# Patient Record
Sex: Female | Born: 1970 | Race: White | Marital: Married | Smoking: Never smoker
Health system: Southern US, Community
[De-identification: ages and names within clinical notes are randomized; demographics above are authoritative.]

---

## 2004-01-07 HISTORY — PX: EYE SURGERY: SHX253

## 2009-01-06 HISTORY — PX: BREAST SURGERY: SHX581

## 2013-03-29 DIAGNOSIS — N840 Polyp of corpus uteri: Secondary | ICD-10-CM | POA: Insufficient documentation

## 2016-12-18 DIAGNOSIS — E041 Nontoxic single thyroid nodule: Secondary | ICD-10-CM | POA: Insufficient documentation

## 2018-04-23 ENCOUNTER — Other Ambulatory Visit: Payer: Self-pay

## 2018-04-23 ENCOUNTER — Telehealth: Payer: Self-pay

## 2018-04-23 ENCOUNTER — Telehealth: Payer: Self-pay | Admitting: Gastroenterology

## 2018-04-23 ENCOUNTER — Ambulatory Visit (INDEPENDENT_AMBULATORY_CARE_PROVIDER_SITE_OTHER): Payer: Managed Care, Other (non HMO) | Admitting: Gastroenterology

## 2018-04-23 DIAGNOSIS — K219 Gastro-esophageal reflux disease without esophagitis: Secondary | ICD-10-CM

## 2018-04-23 DIAGNOSIS — Z1211 Encounter for screening for malignant neoplasm of colon: Secondary | ICD-10-CM

## 2018-04-23 DIAGNOSIS — Z800 Family history of malignant neoplasm of digestive organs: Secondary | ICD-10-CM

## 2018-04-23 NOTE — Progress Notes (Signed)
Jeanette Nichols  [address]  [place]  [place], [place] [address]  Main: [phone]  Fax: [phone]   Gastroenterology Consultation  Referring Provider:     Rowan Blase Primary Care Physician:  Rowan Blase Reason for Consultation:    Colonoscopy         HPI:   Virtual Visit via video  Note  I connected with patient on 04/23/18 at  9:00 AM EDT by video  and verified that I am speaking with the correct person using two identifiers.   I discussed the limitations, risks, security and privacy concerns of performing an evaluation and management service by video and the availability of in person appointments. I also discussed with the patient that there may be a patient responsible charge related to this service. The patient expressed understanding and agreed to proceed.  Location of the patient: Home Location of provider: Home Participating persons: Patient and provider only   History of Present Illness: Chief Complaint  Patient presents with  . Gastroesophageal Reflux     Jeanette Nichols is a 48 y.o. y/o female referred for consultation & management  by Dr. Earlean Shawl, Elenore Paddy, PA-C.     She says that she was "old enough' to have a colonoscopy, never had one before, her father had colon cancer at age 53. Denies any change in bowel habits, rectal bleeding. No change in the shape of her stool.   No issues with siblings. She she also has GERD "very bad"   Reflux:  Onset : 4 years-since getting worse  Symptoms: she says has a burning sensation her throat- TUMS helps right away . Diet is healthy Recent weight gain: no  Medications: no  Narcotics or anticholinergics use : none  PPI /H2 blockers or Antacid  use and timing :no Dinner time : 6-7 pm , goes to bed around 10 pm upright in between   Prior EGD: no  Family history of esophageal cancer:no  Not a smoker.    She weighs 150 lbs and is 5 feet 2 inches- BMI is 27   Prior to Admission  medications   Not on File    No family history on file.   Social History   Tobacco Use  . Smoking status: Not on file  Substance Use Topics  . Alcohol use: Not on file  . Drug use: Not on file    Allergies as of 04/23/2018 - Review Complete 04/23/2018  Allergen Reaction Noted  . Sulfa antibiotics Hives and Itching 08/05/2012  . Penicillins  04/23/2018    Review of Systems:    All systems reviewed and negative except where noted in HPI. General Appearance:    Alert, cooperative, no distress, appears stated age  Head:    Normocephalic, without obvious abnormality, atraumatic  Eyes:    PERRL, conjunctiva/corneas clear,  Ears:    Grossly normal hearing    Neurologic:   Grossly appears normal     Observations/Objective:  Labs: CBC No results found for: WBC, RBC, HGB, HCT, PLT, MCV, MCH, MCHC, RDW, LYMPHSABS, MONOABS, EOSABS, BASOSABS CMP  No results found for: NA, K, CL, CO2, GLUCOSE, BUN, CREATININE, CALCIUM, PROT, ALBUMIN, AST, ALT, ALKPHOS, BILITOT, GFRNONAA, GFRAA  Imaging Studies: No results found.  Assessment and Plan:   Emillee Talsma is a 48 y.o. y/o female has been referred for screening colonoscopy-high risk-father had colon cancer- no red flag symptoms, also has GERD    Plan :  GERD : Counseled on life style changes, suggest to use PPI first thing in the morning on empty stomach and eat 30 minutes after. Advised on the use of a wedge pillow at night , avoid meals for 2 hours prior to bed time. Weight loss . She will try life style changes and if no better will start on medications  Screening colonoscopy    I have discussed alternative options, risks & benefits,  which include, but are not limited to, bleeding, infection, perforation,respiratory complication & drug reaction.  The patient agrees with this plan & written consent will be obtained.    Follow Up Instructions:   4 weeks web visit  I discussed the assessment and treatment plan with the  patient. The patient was provided an opportunity to ask questions and all were answered. The patient agreed with the plan and demonstrated an understanding of the instructions.   The patient was advised to call back or seek an in-person evaluation if the symptoms worsen or if the condition fails to improve as anticipated.    Dr Jeanette Bellows MD,MRCP [facility]) Gastroenterology/Hepatology Pager: [phone]   Speech recognition software was used to dictate the above note.

## 2018-04-23 NOTE — Telephone Encounter (Signed)
Patient returned Michelle's call to schedule a colonoscopy.

## 2018-04-23 NOTE — Telephone Encounter (Signed)
Patients screening colonoscopy has been scheduled for July 7th with Dr. Vicente Males at [facility].  Thanks [employer]

## 2018-04-23 NOTE — Telephone Encounter (Signed)
LVM for pt to call office to schedule her screening colonoscopy with Dr. Vicente Males for June/July.  Thanks [employer]

## 2018-04-26 ENCOUNTER — Ambulatory Visit: Payer: Managed Care, Other (non HMO) | Admitting: Gastroenterology

## 2018-05-20 ENCOUNTER — Ambulatory Visit (INDEPENDENT_AMBULATORY_CARE_PROVIDER_SITE_OTHER): Payer: Managed Care, Other (non HMO) | Admitting: Gastroenterology

## 2018-05-20 DIAGNOSIS — K219 Gastro-esophageal reflux disease without esophagitis: Secondary | ICD-10-CM

## 2018-05-20 NOTE — Progress Notes (Signed)
    Jonathon Bellows , MD [address]  [place]  [place], [place] [address]  Main: [phone]  Fax: [phone]   Primary Care Physician: Lawerance Cruel, PA-C  Virtual Visit via Video Note  I connected with patient on 05/20/18 at  8:30 AM EDT by video and verified that I am speaking with the correct person using two identifiers.   I discussed the limitations, risks, security and privacy concerns of performing an evaluation and management service by video  and the availability of in person appointments. I also discussed with the patient that there may be a patient responsible charge related to this service. The patient expressed understanding and agreed to proceed.  Location of Patient: Home Location of Provider: Home Persons involved: Patient and provider only   History of Present Illness: Chief Complaint  Patient presents with  . Follow-up    Gastroesophageal reflux disease    HPI: Jeanette Nichols is a 48 y.o. female   Summary of history :  Initially referred and seen in 04/2018 for  screening colonoscopy-high risk-father had colon cancer and GERD Colonoscopy : never had one before, her father had colon cancer at age 73. Denies any change in bowel habits, rectal bleeding. No change in the shape of her stool.    GERD: 4 years , burning sensation in the throat, relieved with TUMS,BMI 27   Interval history   04/23/2018-  05/20/2018  Wedge pillow helps at night, still has burning at the level of the collar bones, at night goes into her throat.   Due for colonoscopy on July 7th.     No current outpatient medications on file.   No current facility-administered medications for this visit.     Allergies as of 05/20/2018 - Review Complete 05/20/2018  Allergen Reaction Noted  . Sulfa antibiotics Hives and Itching 08/05/2012  . Penicillins  04/23/2018    Review of Systems:    All systems reviewed and negative except where noted in HPI.  General Appearance:     Alert, cooperative, no distress, appears stated age  Head:    Normocephalic, without obvious abnormality, atraumatic  Eyes:    PERRL, conjunctiva/corneas clear,  Ears:    Grossly normal hearing    Neurologic:  Grossly normal    Observations/Objective:  Labs: CMP  No results found for: NA, K, CL, CO2, GLUCOSE, BUN, CREATININE, CALCIUM, PROT, ALBUMIN, AST, ALT, ALKPHOS, BILITOT, GFRNONAA, GFRAA No results found for: WBC, HGB, HCT, MCV, PLT  Imaging Studies: No results found.  Assessment and Plan:   Jeanette Nichols is a 48 y.o. y/o female here to follow up for screening colonoscopy-high risk-father had colon cancer- no red flag symptoms, also has GERD- life style changes have helped but no resolved . Colonoscopy scheduled for July     Plan :   1. Commence on omeprazole 20 mg, if no better she will call me , may need to add EGD.  F/u in3 months    I discussed the assessment and treatment plan with the patient. The patient was provided an opportunity to ask questions and all were answered. The patient agreed with the plan and demonstrated an understanding of the instructions.   The patient was advised to call back or seek an in-person evaluation if the symptoms worsen or if the condition fails to improve as anticipated.   Dr Jonathon Bellows MD,MRCP [facility]) Gastroenterology/Hepatology Pager: [phone]   Speech recognition software was used to dictate this note.

## 2018-05-21 ENCOUNTER — Ambulatory Visit: Payer: Managed Care, Other (non HMO) | Admitting: Gastroenterology

## 2018-06-28 ENCOUNTER — Telehealth: Payer: Self-pay | Admitting: Gastroenterology

## 2018-06-28 NOTE — Telephone Encounter (Signed)
ok 

## 2018-06-28 NOTE — Telephone Encounter (Signed)
Patient called to cancel colonoscopy 07-13-18.

## 2018-06-29 NOTE — Telephone Encounter (Signed)
Called pt to possibly reschedule her colonoscopy  Unable to contact, LVM to return call

## 2018-07-01 NOTE — Telephone Encounter (Signed)
Pt procedure has been cancelled.

## 2018-07-09 ENCOUNTER — Other Ambulatory Visit: Admission: RE | Admit: 2018-07-09 | Payer: Managed Care, Other (non HMO) | Source: Ambulatory Visit

## 2018-07-13 ENCOUNTER — Ambulatory Visit: Admit: 2018-07-13 | Payer: Managed Care, Other (non HMO) | Admitting: Gastroenterology

## 2018-07-13 SURGERY — COLONOSCOPY WITH PROPOFOL
Anesthesia: General

## 2018-08-26 ENCOUNTER — Ambulatory Visit: Payer: Managed Care, Other (non HMO) | Admitting: Gastroenterology

## 2019-11-29 ENCOUNTER — Encounter: Payer: Self-pay | Admitting: Adult Health

## 2019-11-29 ENCOUNTER — Other Ambulatory Visit: Payer: Self-pay

## 2019-11-29 ENCOUNTER — Ambulatory Visit
Admission: RE | Admit: 2019-11-29 | Discharge: 2019-11-29 | Disposition: A | Discharge status: Home / Self Care | Payer: Managed Care, Other (non HMO) | Attending: Adult Health | Admitting: Adult Health

## 2019-11-29 ENCOUNTER — Ambulatory Visit (INDEPENDENT_AMBULATORY_CARE_PROVIDER_SITE_OTHER): Payer: Managed Care, Other (non HMO) | Admitting: Adult Health

## 2019-11-29 ENCOUNTER — Ambulatory Visit
Admission: RE | Admit: 2019-11-29 | Discharge: 2019-11-29 | Disposition: A | Discharge status: Home / Self Care | Payer: Managed Care, Other (non HMO) | Source: Ambulatory Visit | Attending: Adult Health | Admitting: Adult Health

## 2019-11-29 VITALS — BP 132/72 | HR 68 | Temp 98.2°F | Resp 16 | Ht 62.0 in | Wt 150.0 lb

## 2019-11-29 DIAGNOSIS — R059 Cough, unspecified: Secondary | ICD-10-CM | POA: Diagnosis present

## 2019-11-29 DIAGNOSIS — Z6827 Body mass index (BMI) 27.0-27.9, adult: Secondary | ICD-10-CM | POA: Diagnosis not present

## 2019-11-29 DIAGNOSIS — N921 Excessive and frequent menstruation with irregular cycle: Secondary | ICD-10-CM | POA: Insufficient documentation

## 2019-11-29 DIAGNOSIS — Z0000 Encounter for general adult medical examination without abnormal findings: Secondary | ICD-10-CM | POA: Diagnosis not present

## 2019-11-29 DIAGNOSIS — Z1211 Encounter for screening for malignant neoplasm of colon: Secondary | ICD-10-CM | POA: Diagnosis not present

## 2019-11-29 DIAGNOSIS — E559 Vitamin D deficiency, unspecified: Secondary | ICD-10-CM

## 2019-11-29 NOTE — Patient Instructions (Addendum)
Fat and Cholesterol Restricted Eating Plan Getting too much fat and cholesterol in your diet may cause health problems. Choosing the right foods helps keep your fat and cholesterol at normal levels. This can keep you from getting certain diseases. Your doctor may recommend an eating plan that includes:  Total fat: ______% or less of total calories a day.  Saturated fat: ______% or less of total calories a day.  Cholesterol: less than _________mg a day.  Fiber: ______g a day. What are tips for following this plan? Meal planning  At meals, divide your plate into four equal parts: ? Fill one-half of your plate with vegetables and green salads. ? Fill one-fourth of your plate with whole grains. ? Fill one-fourth of your plate with low-fat (lean) protein foods.  Eat fish that is high in omega-3 fats at least two times a week. This includes mackerel, tuna, sardines, and salmon.  Eat foods that are high in fiber, such as whole grains, beans, apples, broccoli, carrots, peas, and barley. General tips   Work with your doctor to lose weight if you need to.  Avoid: ? Foods with added sugar. ? Fried foods. ? Foods with partially hydrogenated oils.  Limit alcohol intake to no more than 1 drink a day for nonpregnant women and 2 drinks a day for men. One drink equals 12 oz of beer, 5 oz of wine, or 1 oz of hard liquor. Reading food labels  Check food labels for: ? Trans fats. ? Partially hydrogenated oils. ? Saturated fat (g) in each serving. ? Cholesterol (mg) in each serving. ? Fiber (g) in each serving.  Choose foods with healthy fats, such as: ? Monounsaturated fats. ? Polyunsaturated fats. ? Omega-3 fats.  Choose grain products that have whole grains. Look for the word "whole" as the first word in the ingredient list. Cooking  Cook foods using low-fat methods. These include baking, boiling, grilling, and broiling.  Eat more home-cooked foods. Eat at restaurants and  buffets less often.  Avoid cooking using saturated fats, such as butter, cream, palm oil, palm kernel oil, and coconut oil. Recommended foods  Fruits  All fresh, canned (in natural juice), or frozen fruits. Vegetables  Fresh or frozen vegetables (raw, steamed, roasted, or grilled). Green salads. Grains  Whole grains, such as whole wheat or whole grain breads, crackers, cereals, and pasta. Unsweetened oatmeal, bulgur, barley, quinoa, or brown rice. Corn or whole wheat flour tortillas. Meats and other protein foods  Ground beef (85% or leaner), grass-fed beef, or beef trimmed of fat. Skinless chicken or [place]. Ground chicken or [place]. Pork trimmed of fat. All fish and seafood. Egg whites. Dried beans, peas, or lentils. Unsalted nuts or seeds. Unsalted canned beans. Nut butters without added sugar or oil. Dairy  Low-fat or nonfat dairy products, such as skim or 1% milk, 2% or reduced-fat cheeses, low-fat and fat-free ricotta or cottage cheese, or plain low-fat and nonfat yogurt. Fats and oils  Tub margarine without trans fats. Light or reduced-fat mayonnaise and salad dressings. Avocado. Olive, canola, sesame, or safflower oils. The items listed above may not be a complete list of foods and beverages you can eat. Contact a dietitian for more information. Foods to avoid Fruits  Canned fruit in heavy syrup. Fruit in cream or butter sauce. Fried fruit. Vegetables  Vegetables cooked in cheese, cream, or butter sauce. Fried vegetables. Grains  White bread. White pasta. White rice. Cornbread. Bagels, pastries, and croissants. Crackers and snack foods that contain trans  fat and hydrogenated oils. Meats and other protein foods  Fatty cuts of meat. Ribs, chicken wings, bacon, sausage, bologna, salami, chitterlings, fatback, hot dogs, bratwurst, and packaged lunch meats. Liver and organ meats. Whole eggs and egg yolks. Chicken and [place] with skin. Fried meat. Dairy  Whole or 2% milk,  cream, half-and-half, and cream cheese. Whole milk cheeses. Whole-fat or sweetened yogurt. Full-fat cheeses. Nondairy creamers and whipped toppings. Processed cheese, cheese spreads, and cheese curds. Beverages  Alcohol. Sugar-sweetened drinks such as sodas, lemonade, and fruit drinks. Fats and oils  Butter, stick margarine, lard, shortening, ghee, or bacon fat. Coconut, palm kernel, and palm oils. Sweets and desserts  Corn syrup, sugars, honey, and molasses. Candy. Jam and jelly. Syrup. Sweetened cereals. Cookies, pies, cakes, donuts, muffins, and ice cream. The items listed above may not be a complete list of foods and beverages you should avoid. Contact a dietitian for more information. Summary  Choosing the right foods helps keep your fat and cholesterol at normal levels. This can keep you from getting certain diseases.  At meals, fill one-half of your plate with vegetables and green salads.  Eat high-fiber foods, like whole grains, beans, apples, carrots, peas, and barley.  Limit added sugar, saturated fats, alcohol, and fried foods. This information is not intended to replace advice given to you by your health care provider. Make sure you discuss any questions you have with your health care provider. Document Revised: 08/26/2017 Document Reviewed: 09/09/2016 Elsevier Patient Education  [place].  Bronchospasm, Adult  Bronchospasm is when airways in the lungs get smaller. When this happens, it can be hard to breathe. You may cough. You may also make a whistling sound when you breathe (wheeze). Follow these instructions at home: Medicines  Take over-the-counter and prescription medicines only as told by your doctor.  If you need to use an inhaler or nebulizer to take your medicine, ask your doctor how to use it.  If you were given a spacer, always use it with your inhaler. Lifestyle  Change your heating and air conditioning filter. Do this at least once a  month.  Try not to use fireplaces and wood stoves.  Do not  smoke. Do not  allow smoking in your home.  Try not to use things that have a strong smell, like perfume.  Get rid of pests (such as roaches and mice) and their poop.  Remove any mold from your home.  Keep your house clean. Get rid of dust.  Use cleaning products that have no smell.  Replace carpet with wood, tile, or vinyl flooring.  Use allergy-proof pillows, mattress covers, and box spring covers.  Wash bed sheets and blankets every week. Use hot water. Dry them in a dryer.  Use blankets that are made of polyester or cotton.  Wash your hands often.  Keep pets out of your bedroom.  When you exercise, try not to breathe in cold air. General instructions  Have a plan for getting medical care. Know these things: ? When to call your doctor. ? When to call local emergency services (911 in the [place] ? Where to go in an emergency.  Stay up to date on your shots (immunizations).  When you have an episode: ? Stay calm. ? Relax. ? Breathe slowly. Contact a doctor if:  Your muscles ache.  Your chest hurts.  The color of the mucus you cough up (sputum) changes from clear or white to yellow, green, gray, or bloody.  The mucus  you cough up gets thicker.  You have a fever. Get help right away if:  The whistling sound gets worse, even after you take your medicines.  Your coughing gets worse.  You find it even harder to breathe.  Your chest hurts very much. Summary  Bronchospasm is when airways in the lungs get smaller.  When this happens, it can be hard to breathe. You may cough. You may also make a whistling sound when you breathe.  Stay away from things that cause you to have episodes. These include smoke or dust. This information is not intended to replace advice given to you by your health care provider. Make sure you discuss any questions you have with your health care provider. Document  Revised: 12/05/2016 Document Reviewed: 12/27/2015 Elsevier Patient Education  2020 [place] Maintenance, Female Adopting a healthy lifestyle and getting preventive care are important in promoting health and wellness. Ask your health care provider about:  The right schedule for you to have regular tests and exams.  Things you can do on your own to prevent diseases and keep yourself healthy. What should I know about diet, weight, and exercise? Eat a healthy diet   Eat a diet that includes plenty of vegetables, fruits, low-fat dairy products, and lean protein.  Do not eat a lot of foods that are high in solid fats, added sugars, or sodium. Maintain a healthy weight Body mass index (BMI) is used to identify weight problems. It estimates body fat based on height and weight. Your health care provider can help determine your BMI and help you achieve or maintain a healthy weight. Get regular exercise Get regular exercise. This is one of the most important things you can do for your health. Most adults should:  Exercise for at least 150 minutes each week. The exercise should increase your heart rate and make you sweat (moderate-intensity exercise).  Do strengthening exercises at least twice a week. This is in addition to the moderate-intensity exercise.  Spend less time sitting. Even light physical activity can be beneficial. Watch cholesterol and blood lipids Have your blood tested for lipids and cholesterol at 50 years of age, then have this test every 5 years. Have your cholesterol levels checked more often if:  Your lipid or cholesterol levels are high.  You are older than 50 years of age.  You are at high risk for heart disease. What should I know about cancer screening? Depending on your health history and family history, you may need to have cancer screening at various ages. This may include screening for:  Breast cancer.  Cervical cancer.  Colorectal  cancer.  Skin cancer.  Lung cancer. What should I know about heart disease, diabetes, and high blood pressure? Blood pressure and heart disease  High blood pressure causes heart disease and increases the risk of stroke. This is more likely to develop in people who have high blood pressure readings, are of African descent, or are overweight.  Have your blood pressure checked: ? Every 3-5 years if you are 82-10 years of age. ? Every year if you are 41 years old or older. Diabetes Have regular diabetes screenings. This checks your fasting blood sugar level. Have the screening done:  Once every three years after age 44 if you are at a normal weight and have a low risk for diabetes.  More often and at a younger age if you are overweight or have a high risk for diabetes. What should I know about  preventing infection? Hepatitis B If you have a higher risk for hepatitis B, you should be screened for this virus. Talk with your health care provider to find out if you are at risk for hepatitis B infection. Hepatitis C Testing is recommended for:  Everyone born from [birth year] through 1966.  Anyone with known risk factors for hepatitis C. Sexually transmitted infections (STIs)  Get screened for STIs, including gonorrhea and chlamydia, if: ? You are sexually active and are younger than 50 years of age. ? You are older than 50 years of age and your health care provider tells you that you are at risk for this type of infection. ? Your sexual activity has changed since you were last screened, and you are at increased risk for chlamydia or gonorrhea. Ask your health care provider if you are at risk.  Ask your health care provider about whether you are at high risk for HIV. Your health care provider may recommend a prescription medicine to help prevent HIV infection. If you choose to take medicine to prevent HIV, you should first get tested for HIV. You should then be tested every 3 months for as long as  you are taking the medicine. Pregnancy  If you are about to stop having your period (premenopausal) and you may become pregnant, seek counseling before you get pregnant.  Take 400 to 800 micrograms (mcg) of folic acid every day if you become pregnant.  Ask for birth control (contraception) if you want to prevent pregnancy. Osteoporosis and menopause Osteoporosis is a disease in which the bones lose minerals and strength with aging. This can result in bone fractures. If you are 2 years old or older, or if you are at risk for osteoporosis and fractures, ask your health care provider if you should:  Be screened for bone loss.  Take a calcium or vitamin D supplement to lower your risk of fractures.  Be given hormone replacement therapy (HRT) to treat symptoms of menopause. Follow these instructions at home: Lifestyle  Do not use any products that contain nicotine or tobacco, such as cigarettes, e-cigarettes, and chewing tobacco. If you need help quitting, ask your health care provider.  Do not use street drugs.  Do not share needles.  Ask your health care provider for help if you need support or information about quitting drugs. Alcohol use  Do not drink alcohol if: ? Your health care provider tells you not to drink. ? You are pregnant, may be pregnant, or are planning to become pregnant.  If you drink alcohol: ? Limit how much you use to 0-1 drink a day. ? Limit intake if you are breastfeeding.  Be aware of how much alcohol is in your drink. In the [place] one drink equals one 12 oz bottle of beer (355 mL), one 5 oz glass of wine (148 mL), or one 1 oz glass of hard liquor (44 mL). General instructions  Schedule regular health, dental, and eye exams.  Stay current with your vaccines.  Tell your health care provider if: ? You often feel depressed. ? You have ever been abused or do not feel safe at home. Summary  Adopting a healthy lifestyle and getting preventive care are  important in promoting health and wellness.  Follow your health care provider's instructions about healthy diet, exercising, and getting tested or screened for diseases.  Follow your health care provider's instructions on monitoring your cholesterol and blood pressure. This information is not intended to replace advice given to you  by your health care provider. Make sure you discuss any questions you have with your health care provider. Document Revised: 12/16/2017 Document Reviewed: 12/16/2017 Elsevier Patient Education  2020 [employer].

## 2019-11-29 NOTE — Progress Notes (Signed)
Chest x ray within normal limits.

## 2019-11-29 NOTE — Progress Notes (Signed)
New patient visit   Patient: Jeanette Nichols   DOB: 10/21/1970   49 y.o. Female  MRN: [id number] Visit Date: 11/29/2019  Today's healthcare provider: Marcille Buffy, FNP   Chief Complaint  Patient presents with  . New Patient (Initial Visit)   Subjective    Jeanette Nichols is a 49 y.o. female who presents today as a new patient to establish care.  HPI  Patient transferring from [facility] in [place].  Patient sees [facility].  She reports she was really sick before covid started with a flu like illness. She feels she maybe had covid. She is not vaccinated. She is still traveling. Not sick sense then. She feels as if since then she has a feeling of tickling making her want to cough with deep breathing. She has no productive cough. Denies any post nasal drainage or productive cough.  Denies any history of Asthma history. Denies any drug use, or smoking history.   Alcohol occasional - weekly - wine 1-2 glasses at night.   Mammogram up to date 3 D done at gynecologist.   She has had PAP smear this year and was normal. - [facility] see [facility].  History of D & C. She was  scheduled for ablation but she ended up canceling as she feels she is going peri- menopausal. Irregular cycles skipping 1- 2 months. Denies any irregular bleeding now.   Denies any abdominal pain.   She has not had colonoscopy.   Patient  denies any fever, body aches,chills, rash, chest pain, shortness of breath, nausea, vomiting, or diarrhea.  Denies dizziness, lightheadedness, pre syncopal or syncopal episodes.  Would like Cologuard and defer colonoscopy at this time.   She had labs within the last year at November.     Allergies  Allergen Reactions  . Sulfa Antibiotics Hives and Itching    Other reaction(s): HIVES   . Penicillins      History reviewed. No pertinent past medical history. Past Surgical History:  Procedure Laterality Date    . BREAST SURGERY Bilateral 2011   reduction  . EYE SURGERY Bilateral 2006   [place]   Family Status  Relation Name Status  . Mother  Alive  . Father  Alive  . Sister  Alive  . Daughter  Alive  . Son  Alive  . Ethlyn Daniels  (Not Specified)  . PGF  (Not Specified)   Family History  Problem Relation Age of Onset  . Healthy Mother   . Colon cancer Father 68  . Healthy Sister   . Healthy Daughter   . Healthy Son   . Breast cancer Paternal Aunt   . Diabetes Paternal Grandfather    Social History   Socioeconomic History  . Marital status: Married    Spouse name: Not on file  . Number of children: 2  . Years of education: Not on file  . Highest education level: Not on file  Occupational History  . Not on file  Tobacco Use  . Smoking status: Never Smoker  . Smokeless tobacco: Never Used  Vaping Use  . Vaping Use: Never used  Substance and Sexual Activity  . Alcohol use: Yes    Alcohol/week: 5.0 standard drinks    Types: 5 Glasses of wine per week  . Drug use: Never  . Sexual activity: Not on file  Other Topics Concern  . Not on file  Social History Narrative  .  Not on file   Social Determinants of Health   Financial Resource Strain:   . Difficulty of Paying Living Expenses: Not on file  Food Insecurity:   . Worried About Charity fundraiser in the Last Year: Not on file  . Ran Out of Food in the Last Year: Not on file  Transportation Needs:   . Lack of Transportation (Medical): Not on file  . Lack of Transportation (Non-Medical): Not on file  Physical Activity:   . Days of Exercise per Week: Not on file  . Minutes of Exercise per Session: Not on file  Stress:   . Feeling of Stress : Not on file  Social Connections:   . Frequency of Communication with Friends and Family: Not on file  . Frequency of Social Gatherings with Friends and Family: Not on file  . Attends Religious Services: Not on file  . Active Member of Clubs or Organizations: Not on file  . Attends  Archivist Meetings: Not on file  . Marital Status: Not on file   No outpatient medications prior to visit.   No facility-administered medications prior to visit.   Allergies  Allergen Reactions  . Sulfa Antibiotics Hives and Itching    Other reaction(s): HIVES   . Penicillins      There is no immunization history on file for this patient.  Health Maintenance  Topic Date Due  . PAP SMEAR-Modifier  11/29/2019 (Originally 10/17/1990)  . COVID-19 Vaccine (1) 12/15/2019 (Originally 10/16/1981)  . INFLUENZA VACCINE  04/05/2020 (Originally 08/07/2019)  . MAMMOGRAM  11/28/2020 (Originally 10/17/2019)  . COLONOSCOPY  11/28/2020 (Originally 10/17/2019)  . TETANUS/TDAP  11/28/2020 (Originally 10/16/1988)  . Hepatitis C Screening  11/28/2020 (Originally 11/04/1970)  . HIV Screening  11/28/2020 (Originally 10/16/1984)    Patient Care Team: Charod Slawinski, Kelby Aline, FNP as PCP - General (Family Medicine)  Review of Systems  Constitutional: Negative.   HENT: Negative.   Respiratory: Positive for cough. Negative for apnea, choking, chest tightness, shortness of breath, wheezing and stridor.   Cardiovascular: Negative.   Gastrointestinal: Negative.   Genitourinary: Negative.   Musculoskeletal: Negative.   Skin: Negative.   Neurological: Negative.   Hematological: Negative.   Psychiatric/Behavioral: Negative.       Objective    BP 132/72 (BP Location: Left Arm, Patient Position: Sitting, Cuff Size: Normal)   Pulse 68   Temp 98.2 F (36.8 C) (Oral)   Resp 16   Ht 5\' 2"  (1.575 m)   Wt 150 lb (68 kg)   LMP 09/26/2019 (Approximate)   SpO2 98%   BMI 27.44 kg/m  Physical Exam Vitals reviewed.  Constitutional:      General: She is not in acute distress.    Appearance: Normal appearance. She is well-developed. She is not ill-appearing, toxic-appearing or diaphoretic.     Interventions: She is not intubated.    Comments: Patient appers well, not sickly. Speaking in  complete sentences. Patient moves on and off of exam table and in room without difficulty. Gait is normal in hall and in room. Patient is oriented to person place time and situation. Patient answers questions appropriately and engages eye contact and verbal dialect with provider.   HENT:     Head: Normocephalic and atraumatic.     Right Ear: External ear normal.     Left Ear: External ear normal.     Nose: Nose normal.     Mouth/Throat:     Pharynx: No oropharyngeal exudate.  Eyes:  General: Lids are normal. No scleral icterus.       Right eye: No discharge.        Left eye: No discharge.     Conjunctiva/sclera: Conjunctivae normal.     Right eye: Right conjunctiva is not injected. No exudate or hemorrhage.    Left eye: Left conjunctiva is not injected. No exudate or hemorrhage.    Pupils: Pupils are equal, round, and reactive to light.  Neck:     Thyroid: No thyroid mass or thyromegaly.     Vascular: Normal carotid pulses. No carotid bruit, hepatojugular reflux or JVD.     Trachea: Trachea and phonation normal. No tracheal tenderness or tracheal deviation.     Meningeal: Brudzinski's sign and Kernig's sign absent.  Cardiovascular:     Rate and Rhythm: Normal rate and regular rhythm.     Pulses: Normal pulses.          Radial pulses are 2+ on the right side and 2+ on the left side.       Dorsalis pedis pulses are 2+ on the right side and 2+ on the left side.       Posterior tibial pulses are 2+ on the right side and 2+ on the left side.     Heart sounds: Normal heart sounds, S1 normal and S2 normal. Heart sounds not distant. No murmur heard.  No friction rub. No gallop.   Pulmonary:     Effort: Pulmonary effort is normal. No tachypnea, bradypnea, accessory muscle usage or respiratory distress. She is not intubated.     Breath sounds: Normal breath sounds. No stridor. No wheezing, rhonchi or rales.  Chest:     Chest wall: No tenderness.  Abdominal:     General: Bowel sounds are  normal. There is no distension or abdominal bruit.     Palpations: Abdomen is soft. There is no shifting dullness, fluid wave, hepatomegaly, splenomegaly, mass or pulsatile mass.     Tenderness: There is no abdominal tenderness. There is no right CVA tenderness, left CVA tenderness, guarding or rebound.     Hernia: No hernia is present.  Musculoskeletal:        General: No tenderness or deformity. Normal range of motion.     Cervical back: Full passive range of motion without pain, normal range of motion and neck supple. No edema, erythema, rigidity or tenderness. No spinous process tenderness or muscular tenderness. Normal range of motion.  Lymphadenopathy:     Head:     Right side of head: No submental, submandibular, tonsillar, preauricular, posterior auricular or occipital adenopathy.     Left side of head: No submental, submandibular, tonsillar, preauricular, posterior auricular or occipital adenopathy.     Cervical: No cervical adenopathy.     Right cervical: No superficial, deep or posterior cervical adenopathy.    Left cervical: No superficial, deep or posterior cervical adenopathy.     Upper Body:     Right upper body: No supraclavicular or pectoral adenopathy.     Left upper body: No supraclavicular or pectoral adenopathy.  Skin:    General: Skin is warm and dry.     Coloration: Skin is not pale.     Findings: No abrasion, bruising, burn, ecchymosis, erythema, lesion, petechiae or rash.     Nails: There is no clubbing.  Neurological:     General: No focal deficit present.     Mental Status: She is alert and oriented to person, place, and time.  GCS: GCS eye subscore is 4. GCS verbal subscore is 5. GCS motor subscore is 6.     Cranial Nerves: No cranial nerve deficit.     Sensory: No sensory deficit.     Motor: No weakness, tremor, atrophy, abnormal muscle tone or seizure activity.     Coordination: Coordination normal.     Gait: Gait normal.     Deep Tendon Reflexes:  Reflexes are normal and symmetric. Reflexes normal. Babinski sign absent on the right side. Babinski sign absent on the left side.     Reflex Scores:      Tricep reflexes are 2+ on the right side and 2+ on the left side.      Bicep reflexes are 2+ on the right side and 2+ on the left side.      Brachioradialis reflexes are 2+ on the right side and 2+ on the left side.      Patellar reflexes are 2+ on the right side and 2+ on the left side.      Achilles reflexes are 2+ on the right side and 2+ on the left side. Psychiatric:        Mood and Affect: Mood normal.        Speech: Speech normal.        Behavior: Behavior normal.        Thought Content: Thought content normal.        Judgment: Judgment normal.      Depression Screen PHQ 2/9 Scores 11/29/2019  PHQ - 2 Score 0  PHQ- 9 Score 0   No results found for any visits on 11/29/19.  Assessment & Plan       Routine general medical examination at a health care facility  Body mass index 27.0-27.9, adult  Screening for colon cancer - Plan: Cologuard, VITAMIN D 25 Hydroxy (Vit-D Deficiency, Fractures)  Vitamin D insufficiency - Plan: VITAMIN D 25 Hydroxy (Vit-D Deficiency, Fractures)  Cough - Plan: CBC with Differential/Platelet, Comprehensive Metabolic Panel (CMET), TSH, Lipid Panel w/o Chol/HDL Ratio, DG Chest 2 View   Orders Placed This Encounter  Procedures  . DG Chest 2 View  . Cologuard  . CBC with Differential/Platelet  . Comprehensive Metabolic Panel (CMET)  . TSH  . Lipid Panel w/o Chol/HDL Ratio  . VITAMIN D 25 Hydroxy (Vit-D Deficiency, Fractures)    Discussed albuterol if chest x ray within normal limits.   The patient is advised to begin progressive daily aerobic exercise program, follow a low fat, low cholesterol diet, attempt to lose weight, reduce exposure to stress, continue current medications, continue current healthy lifestyle patterns and return for routine annual checkups. Red Flags discussed. The  patient was given clear instructions to go to ER or return to medical center if any red flags develop, symptoms do not improve, worsen or new problems develop. They verbalized understanding.   Return in 1 month (on 12/29/2019), or if symptoms worsen or fail to improve, for at any time for any worsening symptoms, Go to Emergency room/ urgent care if worse, as scheduled.    Red Flags discussed. The patient was given clear instructions to go to ER or return to medical center if any red flags develop, symptoms do not improve, worsen or new problems develop. They verbalized understanding.     Marcille Buffy, [place] [phone] (phone) [phone] (fax)  [place]

## 2019-12-26 ENCOUNTER — Other Ambulatory Visit: Payer: Self-pay | Admitting: Adult Health

## 2019-12-26 DIAGNOSIS — Z9289 Personal history of other medical treatment: Secondary | ICD-10-CM

## 2019-12-26 DIAGNOSIS — D259 Leiomyoma of uterus, unspecified: Secondary | ICD-10-CM

## 2019-12-26 DIAGNOSIS — N854 Malposition of uterus: Secondary | ICD-10-CM

## 2019-12-26 NOTE — Progress Notes (Signed)
Chart review from [facility]ologist is Theodoro Clock MD. Record sent to be faxed in chart.

## 2020-01-12 ENCOUNTER — Telehealth: Payer: Self-pay

## 2020-01-12 NOTE — Telephone Encounter (Signed)
Copied from CRM [id number]. Topic: General - Inquiry >> Jan 12, 2020 10:16 AM Adrian Prince D wrote: Reason for VFI:EPPIRJJ called to check on the status of her colorguard. She can be reached at [phone]. Please advise

## 2020-01-13 NOTE — Telephone Encounter (Signed)
Patient has not heard from [place] guard ordered on 11/23 will reprint order for you to sign and ill refax. KW

## 2020-01-13 NOTE — Telephone Encounter (Signed)
Have you seen results back on this patient? KW

## 2020-01-13 NOTE — Telephone Encounter (Signed)
No have not seen results, no note seen in chart and also checked media no results. When did she do it ?

## 2020-01-18 NOTE — Telephone Encounter (Signed)
Ok so she has not even completed cologuard yet then ?

## 2020-01-19 NOTE — Telephone Encounter (Signed)
No. KW

## 2020-01-19 NOTE — Telephone Encounter (Signed)
Noted - thank you for follow up

## 2020-02-13 NOTE — Telephone Encounter (Signed)
Im not sure why this patient has not been contacted yet by company but I will refax order form. KW

## 2020-02-13 NOTE — Telephone Encounter (Signed)
Patient is is calling because she has not heard from [place] guard ordered on 11/23  [phone]

## 2020-02-14 NOTE — Telephone Encounter (Signed)
Thank you we ordered [place] guard she should have heard by now.

## 2020-03-02 ENCOUNTER — Encounter: Payer: Self-pay | Admitting: Adult Health

## 2020-03-03 LAB — COLOGUARD: Cologuard: NEGATIVE

## 2020-03-09 LAB — EXTERNAL GENERIC LAB PROCEDURE: COLOGUARD: NEGATIVE

## 2020-03-16 ENCOUNTER — Encounter: Payer: Self-pay | Admitting: Adult Health

## 2020-03-19 ENCOUNTER — Telehealth: Payer: Self-pay

## 2020-03-19 NOTE — Telephone Encounter (Signed)
Called and spoke with patient and reviewed over her recent results,patient had no further questions. KW

## 2020-03-19 NOTE — Telephone Encounter (Signed)
Copied from [place] [id number] [phone]. Topic: General - Other >> Mar 19, 2020  9:24 AM Alanda Slim E wrote: Reason for CRM: Pt called and stated she received a letter from cologuard that er results were sent to her PCP and she would like to speak with someone about the results/ please advise

## 2020-05-09 NOTE — Progress Notes (Signed)
Received benign endometrial polyp [facility] from [facility].

## 2021-01-28 ENCOUNTER — Ambulatory Visit (INDEPENDENT_AMBULATORY_CARE_PROVIDER_SITE_OTHER): Payer: Managed Care, Other (non HMO) | Admitting: Family Medicine

## 2021-01-28 ENCOUNTER — Other Ambulatory Visit: Payer: Self-pay

## 2021-01-28 ENCOUNTER — Encounter: Payer: Self-pay | Admitting: Family Medicine

## 2021-01-28 VITALS — BP 125/76 | HR 62 | Resp 16 | Ht 62.0 in | Wt 159.3 lb

## 2021-01-28 DIAGNOSIS — E559 Vitamin D deficiency, unspecified: Secondary | ICD-10-CM

## 2021-01-28 DIAGNOSIS — Z6829 Body mass index (BMI) 29.0-29.9, adult: Secondary | ICD-10-CM

## 2021-01-28 DIAGNOSIS — E663 Overweight: Secondary | ICD-10-CM

## 2021-01-28 DIAGNOSIS — Z0000 Encounter for general adult medical examination without abnormal findings: Secondary | ICD-10-CM

## 2021-01-28 DIAGNOSIS — Z131 Encounter for screening for diabetes mellitus: Secondary | ICD-10-CM | POA: Diagnosis not present

## 2021-01-28 DIAGNOSIS — M79672 Pain in left foot: Secondary | ICD-10-CM

## 2021-01-28 DIAGNOSIS — Z1231 Encounter for screening mammogram for malignant neoplasm of breast: Secondary | ICD-10-CM | POA: Insufficient documentation

## 2021-01-28 DIAGNOSIS — G8929 Other chronic pain: Secondary | ICD-10-CM

## 2021-01-28 DIAGNOSIS — Z136 Encounter for screening for cardiovascular disorders: Secondary | ICD-10-CM

## 2021-01-28 DIAGNOSIS — Z13220 Encounter for screening for lipoid disorders: Secondary | ICD-10-CM

## 2021-01-28 NOTE — Assessment & Plan Note (Signed)
Continue to recommend balanced, lower carb meals. Smaller meal size, adding snacks. Choosing water as drink of choice and increasing purposeful exercise. Check A1c

## 2021-01-28 NOTE — Assessment & Plan Note (Signed)
UTD on eye and dental Things to do to keep yourself healthy  - Exercise at least 30-45 minutes a day, 3-4 days a week.  - Eat a low-fat diet with lots of fruits and vegetables, up to 7-9 servings per day.  - Seatbelts can save your life. Wear them always.  - Smoke detectors on every level of your home, check batteries every year.  - Eye Doctor - have an eye exam every 1-2 years  - Safe sex - if you may be exposed to STDs, use a condom.  - Alcohol -  If you drink, do it moderately, less than 2 drinks per day.  - [place]. Choose someone to speak for you if you are not able.  - Depression is common in our stressful world.If you're feeling down or losing interest in things you normally enjoy, please come in for a visit.  - Violence - If anyone is threatening or hurting you, please call immediately.

## 2021-01-28 NOTE — Assessment & Plan Note (Signed)
Has worked with PT On prednisone Refer to podiatry

## 2021-01-28 NOTE — Progress Notes (Signed)
Complete physical exam   Patient: Jeanette Nichols   DOB: 07-03-1970   51 y.o. Female  MRN: [id number] Visit Date: 01/28/2021  Today's healthcare provider: Gwyneth Sprout, FNP   Chief Complaint  Patient presents with   Annual Exam   Subjective    HPI  Jeanette Nichols is a 51 y.o. female who presents today for a complete physical exam.  She reports consuming a general diet. The patient does not participate in regular exercise at present. She generally feels fairly well. She reports sleeping well. She does not have additional problems to discuss today.  Last Reported Pap-03/23/19 Cologuard- ordered 11/29/19 Mamm- 03/23/19 History reviewed. No pertinent past medical history. Past Surgical History:  Procedure Laterality Date   BREAST SURGERY Bilateral 2011   reduction   EYE SURGERY Bilateral 2006   [place]   Social History   Socioeconomic History   Marital status: Married    Spouse name: Not on file   Number of children: 2   Years of education: Not on file   Highest education level: Not on file  Occupational History   Not on file  Tobacco Use   Smoking status: Never   Smokeless tobacco: Never  Vaping Use   Vaping Use: Never used  Substance and Sexual Activity   Alcohol use: Yes    Alcohol/week: 5.0 standard drinks    Types: 5 Glasses of wine per week   Drug use: Never   Sexual activity: Not on file  Other Topics Concern   Not on file  Social History Narrative   Not on file   Social Determinants of Health   Financial Resource Strain: Not on file  Food Insecurity: Not on file  Transportation Needs: Not on file  Physical Activity: Not on file  Stress: Not on file  Social Connections: Not on file  Intimate Partner Violence: Not on file   Family Status  Relation Name Status   Mother  Alive   Father  Alive   Sister  Alive   Daughter  Alive   Son  Alive   Field seismologist  (Not Specified)   PGF  (Not Specified)   Family History  Problem Relation Age of  Onset   Healthy Mother    Colon cancer Father 30   Healthy Sister    Healthy Daughter    Healthy Son    Breast cancer Paternal Aunt    Diabetes Paternal Grandfather    Allergies  Allergen Reactions   Sulfa Antibiotics Hives and Itching    Other reaction(s): HIVES    Penicillins     Patient Care Team: Gwyneth Sprout, FNP as PCP - General (Family Medicine)   Medications: No outpatient medications prior to visit.   No facility-administered medications prior to visit.    Review of Systems  All other systems reviewed and are negative.    Objective    BP 125/76    Pulse 62    Resp 16    Ht 5\' 2"  (1.575 m)    Wt 159 lb 4.8 oz (72.3 kg)    SpO2 99%    BMI 29.14 kg/m    Physical Exam Vitals and nursing note reviewed.  Constitutional:      General: She is awake. She is not in acute distress.    Appearance: Normal appearance. She is well-developed, well-groomed and overweight. She is not ill-appearing, toxic-appearing or diaphoretic.  HENT:     Head: Normocephalic and atraumatic.  Jaw: There is normal jaw occlusion. No trismus, tenderness, swelling or pain on movement.     Right Ear: Hearing, tympanic membrane, ear canal and external ear normal. There is no impacted cerumen.     Left Ear: Hearing, tympanic membrane, ear canal and external ear normal. There is no impacted cerumen.     Nose: Nose normal. No congestion or rhinorrhea.     Right Turbinates: Not enlarged, swollen or pale.     Left Turbinates: Not enlarged, swollen or pale.     Right Sinus: No maxillary sinus tenderness or frontal sinus tenderness.     Left Sinus: No maxillary sinus tenderness or frontal sinus tenderness.     Mouth/Throat:     Lips: Pink.     Mouth: Mucous membranes are moist. No injury.     Tongue: No lesions.     Pharynx: Oropharynx is clear. Uvula midline. No pharyngeal swelling, oropharyngeal exudate, posterior oropharyngeal erythema or uvula swelling.     Tonsils: No tonsillar exudate or  tonsillar abscesses.  Eyes:     General: Lids are normal. Lids are everted, no foreign bodies appreciated. Vision grossly intact. Gaze aligned appropriately. No allergic shiner or visual field deficit.       Right eye: No discharge.        Left eye: No discharge.     Extraocular Movements: Extraocular movements intact.     Conjunctiva/sclera: Conjunctivae normal.     Right eye: Right conjunctiva is not injected. No exudate.    Left eye: Left conjunctiva is not injected. No exudate.    Pupils: Pupils are equal, round, and reactive to light.  Neck:     Thyroid: No thyroid mass, thyromegaly or thyroid tenderness.     Vascular: No carotid bruit.     Trachea: Trachea normal.  Cardiovascular:     Rate and Rhythm: Normal rate and regular rhythm.     Pulses: Normal pulses.          Carotid pulses are 2+ on the right side and 2+ on the left side.      Radial pulses are 2+ on the right side and 2+ on the left side.       Dorsalis pedis pulses are 2+ on the right side and 2+ on the left side.       Posterior tibial pulses are 2+ on the right side and 2+ on the left side.     Heart sounds: Normal heart sounds, S1 normal and S2 normal. No murmur heard.   No friction rub. No gallop.  Pulmonary:     Effort: Pulmonary effort is normal. No respiratory distress.     Breath sounds: Normal breath sounds and air entry. No stridor. No wheezing, rhonchi or rales.  Chest:     Chest wall: No tenderness.     Comments: Breast exam deferred; discussed self exam Abdominal:     General: Abdomen is flat. Bowel sounds are normal. There is no distension.     Palpations: Abdomen is soft. There is no mass.     Tenderness: There is no abdominal tenderness. There is no right CVA tenderness, left CVA tenderness, guarding or rebound.     Hernia: No hernia is present.  Genitourinary:    Comments: Exam deferred; denies complaints Musculoskeletal:        General: No swelling, tenderness, deformity or signs of injury.  Normal range of motion.     Cervical back: Full passive range of motion without pain, normal range  of motion and neck supple. No edema, rigidity or tenderness. No muscular tenderness.     Right lower leg: No edema.     Left lower leg: No edema.  Lymphadenopathy:     Cervical: No cervical adenopathy.     Right cervical: No superficial, deep or posterior cervical adenopathy.    Left cervical: No superficial, deep or posterior cervical adenopathy.  Skin:    General: Skin is warm and dry.     Capillary Refill: Capillary refill takes less than 2 seconds.     Coloration: Skin is not jaundiced or pale.     Findings: No bruising, erythema, lesion or rash.  Neurological:     General: No focal deficit present.     Mental Status: She is alert and oriented to person, place, and time. Mental status is at baseline.     GCS: GCS eye subscore is 4. GCS verbal subscore is 5. GCS motor subscore is 6.     Sensory: Sensation is intact. No sensory deficit.     Motor: Motor function is intact. No weakness.     Coordination: Coordination is intact. Coordination normal.     Gait: Gait is intact. Gait normal.  Psychiatric:        Attention and Perception: Attention and perception normal.        Mood and Affect: Mood and affect normal.        Speech: Speech normal.        Behavior: Behavior normal. Behavior is cooperative.        Thought Content: Thought content normal.        Cognition and Memory: Cognition and memory normal.        Judgment: Judgment normal.     Last depression screening scores PHQ 2/9 Scores 01/28/2021 11/29/2019  PHQ - 2 Score 0 0  PHQ- 9 Score 0 0   Last fall risk screening Fall Risk  11/29/2019  Falls in the past year? 0  Number falls in past yr: 0  Injury with Fall? 0  Risk for fall due to : No Fall Risks  Follow up Falls evaluation completed   Last Audit-C alcohol use screening Alcohol Use Disorder Test (AUDIT) 11/29/2019  1. How often do you have a drink containing  alcohol? 2  2. How many drinks containing alcohol do you have on a typical day when you are drinking? 0  3. How often do you have six or more drinks on one occasion? 0  AUDIT-C Score 2  Alcohol Brief Interventions/Follow-up AUDIT Score <7 follow-up not indicated   A score of 3 or more in women, and 4 or more in men indicates increased risk for alcohol abuse, EXCEPT if all of the points are from question 1   No results found for any visits on 01/28/21.  Assessment & Plan    Routine Health Maintenance and Physical Exam  Exercise Activities and Dietary recommendations  Goals   None      There is no immunization history on file for this patient.  Health Maintenance  Topic Date Due   COVID-19 Vaccine (1) Never done   HIV Screening  Never done   Hepatitis C Screening  Never done   TETANUS/TDAP  Never done   COLONOSCOPY (Pts 45-67yrs Insurance coverage will need to be confirmed)  Never done   Zoster Vaccines- Shingrix (1 of 2) Never done   INFLUENZA VACCINE  Never done   MAMMOGRAM  03/22/2021   PAP SMEAR-Modifier  04/13/2022  HPV VACCINES  Aged Out    Discussed health benefits of physical activity, and encouraged her to engage in regular exercise appropriate for her age and condition.  Problem List Items Addressed This Visit       Other   Vitamin D insufficiency    On supplementation 1000-2000 IU/day Repeat lab      Relevant Orders   VITAMIN D 25 Hydroxy (Vit-D Deficiency, Fractures)   Screening for diabetes mellitus    Continue to recommend balanced, lower carb meals. Smaller meal size, adding snacks. Choosing water as drink of choice and increasing purposeful exercise. Check A1c      Annual physical exam - Primary    UTD on eye and dental Things to do to keep yourself healthy  - Exercise at least 30-45 minutes a day, 3-4 days a week.  - Eat a low-fat diet with lots of fruits and vegetables, up to 7-9 servings per day.  - Seatbelts can save your life. Wear them  always.  - Smoke detectors on every level of your home, check batteries every year.  - Eye Doctor - have an eye exam every 1-2 years  - Safe sex - if you may be exposed to STDs, use a condom.  - Alcohol -  If you drink, do it moderately, less than 2 drinks per day.  - [place]. Choose someone to speak for you if you are not able.  - Depression is common in our stressful world.If you're feeling down or losing interest in things you normally enjoy, please come in for a visit.  - Violence - If anyone is threatening or hurting you, please call immediately.        Relevant Orders   Comprehensive metabolic panel   CBC with Differential/Platelet   Overweight with body mass index (BMI) of 29 to 29.9 in adult    BMI at 29 Discussed importance of healthy weight management Discussed diet and exercise       Relevant Orders   TSH   Hemoglobin A1c   Encounter for lipid screening for cardiovascular disease    Repeat lipid screening recommend diet low in saturated fat and regular exercise - 30 min at least 5 times per week       Relevant Orders   Lipid panel   Breast cancer screening by mammogram    Due for mammo Order placed Deferred exam in office Denies complaints      Relevant Orders   MM 3D SCREEN BREAST BILATERAL   Heel pain, chronic, left    Has worked with PT On prednisone Refer to podiatry       Relevant Orders   Ambulatory referral to Podiatry     Return in about 3 months (around 04/28/2021) for PAP.     Vonna Kotyk, FNP, have reviewed all documentation for this visit. The documentation on 01/28/21 for the exam, diagnosis, procedures, and orders are all accurate and complete.  Patient seen and examined by Tally Joe,  FNP note scribed by Jennings Books, [place], [place] [phone] (phone) [phone] (fax)  [place]

## 2021-01-28 NOTE — Assessment & Plan Note (Signed)
Due for mammo Order placed Deferred exam in office Denies complaints

## 2021-01-28 NOTE — Assessment & Plan Note (Signed)
On supplementation 1000-2000 IU/day Repeat lab

## 2021-01-28 NOTE — Assessment & Plan Note (Signed)
Repeat lipid screening recommend diet low in saturated fat and regular exercise - 30 min at least 5 times per week

## 2021-01-28 NOTE — Assessment & Plan Note (Signed)
BMI at 29 Discussed importance of healthy weight management Discussed diet and exercise

## 2021-01-29 LAB — CBC WITH DIFFERENTIAL/PLATELET
Basophils Absolute: 0 10*3/uL (ref 0.0–0.2)
Basos: 1 %
EOS (ABSOLUTE): 0.1 10*3/uL (ref 0.0–0.4)
Eos: 1 %
Hematocrit: 42.3 % (ref 34.0–46.6)
Hemoglobin: 14.7 g/dL (ref 11.1–15.9)
Immature Grans (Abs): 0 10*3/uL (ref 0.0–0.1)
Immature Granulocytes: 0 %
Lymphocytes Absolute: 2.5 10*3/uL (ref 0.7–3.1)
Lymphs: 34 %
MCH: 30.3 pg (ref 26.6–33.0)
MCHC: 34.8 g/dL (ref 31.5–35.7)
MCV: 87 fL (ref 79–97)
Monocytes Absolute: 0.6 10*3/uL (ref 0.1–0.9)
Monocytes: 9 %
Neutrophils Absolute: 4.1 10*3/uL (ref 1.4–7.0)
Neutrophils: 55 %
Platelets: 246 10*3/uL (ref 150–450)
RBC: 4.85 x10E6/uL (ref 3.77–5.28)
RDW: 13.2 % (ref 11.7–15.4)
WBC: 7.3 10*3/uL (ref 3.4–10.8)

## 2021-01-29 LAB — COMPREHENSIVE METABOLIC PANEL
ALT: 21 IU/L (ref 0–32)
AST: 16 IU/L (ref 0–40)
Albumin/Globulin Ratio: 2 (ref 1.2–2.2)
Albumin: 4.6 g/dL (ref 3.8–4.9)
Alkaline Phosphatase: 82 IU/L (ref 44–121)
BUN/Creatinine Ratio: 16 (ref 9–23)
BUN: 13 mg/dL (ref 6–24)
Bilirubin Total: 0.3 mg/dL (ref 0.0–1.2)
CO2: 24 mmol/L (ref 20–29)
Calcium: 9.5 mg/dL (ref 8.7–10.2)
Chloride: 103 mmol/L (ref 96–106)
Creatinine, Ser: 0.79 mg/dL (ref 0.57–1.00)
Globulin, Total: 2.3 g/dL (ref 1.5–4.5)
Glucose: 92 mg/dL (ref 70–99)
Potassium: 4.6 mmol/L (ref 3.5–5.2)
Sodium: 140 mmol/L (ref 134–144)
Total Protein: 6.9 g/dL (ref 6.0–8.5)
eGFR: 91 mL/min/{1.73_m2} (ref >59)

## 2021-01-29 LAB — LIPID PANEL
Chol/HDL Ratio: 4 ratio (ref 0.0–4.4)
Cholesterol, Total: 156 mg/dL (ref 100–199)
HDL: 39 mg/dL — ABNORMAL LOW (ref >39)
LDL Chol Calc (NIH): 96 mg/dL (ref 0–99)
Triglycerides: 117 mg/dL (ref 0–149)
VLDL Cholesterol Cal: 21 mg/dL (ref 5–40)

## 2021-01-29 LAB — HEMOGLOBIN A1C
Est. average glucose Bld gHb Est-mCnc: 111 mg/dL
Hgb A1c MFr Bld: 5.5 % (ref 4.8–5.6)

## 2021-01-29 LAB — TSH: TSH: 0.509 u[IU]/mL (ref 0.450–4.500)

## 2021-01-29 LAB — VITAMIN D 25 HYDROXY (VIT D DEFICIENCY, FRACTURES): Vit D, 25-Hydroxy: 29.5 ng/mL — ABNORMAL LOW (ref 30.0–100.0)

## 2021-01-30 ENCOUNTER — Ambulatory Visit (INDEPENDENT_AMBULATORY_CARE_PROVIDER_SITE_OTHER): Payer: Managed Care, Other (non HMO)

## 2021-01-30 ENCOUNTER — Other Ambulatory Visit: Payer: Self-pay

## 2021-01-30 ENCOUNTER — Ambulatory Visit (INDEPENDENT_AMBULATORY_CARE_PROVIDER_SITE_OTHER): Payer: Managed Care, Other (non HMO) | Admitting: Podiatry

## 2021-01-30 ENCOUNTER — Encounter: Payer: Self-pay | Admitting: Podiatry

## 2021-01-30 DIAGNOSIS — M7750 Other enthesopathy of unspecified foot: Secondary | ICD-10-CM | POA: Diagnosis not present

## 2021-01-30 DIAGNOSIS — M629 Disorder of muscle, unspecified: Secondary | ICD-10-CM | POA: Diagnosis not present

## 2021-01-30 DIAGNOSIS — M722 Plantar fascial fibromatosis: Secondary | ICD-10-CM | POA: Diagnosis not present

## 2021-01-30 DIAGNOSIS — M7752 Other enthesopathy of left foot: Secondary | ICD-10-CM

## 2021-02-03 ENCOUNTER — Encounter: Payer: Self-pay | Admitting: Podiatry

## 2021-02-03 NOTE — Progress Notes (Signed)
°  Subjective:  Patient ID: Jeanette Nichols, female    DOB: 1970-03-21,  MRN: [id number]  Chief Complaint  Patient presents with   Foot Pain     (xray)np-chronic L heel pain x 1-2 months-non req-Elise Rollene Rotunda, NP refer    51 y.o. female presents with the above complaint. History confirmed with patient.  This is been going on severely for the last 1 to 2 months but has been on and off issue for the last year.  She was seeing an orthopedist at [facility].  Has not been in a boot.  She has tried using anti-inflammatories and stretching therapy at home and this has not helped.  A few weeks ago she felt a severe pop and had quite a lot of bruising in the foot  Objective:  Physical Exam: warm, good capillary refill, no trophic changes or ulcerative lesions, normal DP and PT pulses, normal sensory exam, and severe pain at the insertion of the plantar fascia on the left heel   Radiographs: Multiple views x-ray of the left foot: no fracture, dislocation, swelling or degenerative changes noted Assessment:   1. Nontraumatic tear of plantar fascia   2. Plantar fasciitis of left foot   3. Tendonitis of ankle or foot      Plan:  Patient was evaluated and treated and all questions answered.  Suspect she has had Planter fasciitis but with the recent pop and ecchymosis I suspect she has a plantar fascial tear.  I recommend an MRI to evaluate for the tear before proceeding with any further therapy or treatment.  We also discussed surgical treatment of this if is not improving.  Dispensed a cam boot and she will be WBAT in this.  Return in about 6 weeks (around 03/13/2021) for after MRI to review.

## 2021-02-07 ENCOUNTER — Telehealth: Payer: Self-pay | Admitting: Podiatry

## 2021-02-07 NOTE — Telephone Encounter (Signed)
Patient called stating she has not heard anything from the facility for her MRI. Patient was wondering when she will hear from them

## 2021-02-13 ENCOUNTER — Ambulatory Visit
Admission: RE | Admit: 2021-02-13 | Discharge: 2021-02-13 | Disposition: A | Discharge status: Home / Self Care | Payer: Managed Care, Other (non HMO) | Source: Ambulatory Visit | Attending: Podiatry | Admitting: Podiatry

## 2021-02-13 DIAGNOSIS — M629 Disorder of muscle, unspecified: Secondary | ICD-10-CM

## 2021-02-20 ENCOUNTER — Ambulatory Visit (INDEPENDENT_AMBULATORY_CARE_PROVIDER_SITE_OTHER): Payer: Managed Care, Other (non HMO) | Admitting: Podiatry

## 2021-02-20 ENCOUNTER — Encounter: Payer: Self-pay | Admitting: Podiatry

## 2021-02-20 ENCOUNTER — Other Ambulatory Visit: Payer: Self-pay

## 2021-02-20 DIAGNOSIS — M722 Plantar fascial fibromatosis: Secondary | ICD-10-CM

## 2021-02-24 ENCOUNTER — Encounter: Payer: Self-pay | Admitting: Podiatry

## 2021-02-24 NOTE — Progress Notes (Signed)
°  Subjective:  Patient ID: Jeanette Nichols, female    DOB: 12/30/1970,  MRN: [id number]  Chief Complaint  Patient presents with   Plantar Fasciitis    "The pain is still there, it feels better when I wear the boot, but when I have the boot off, it gets painful again"    51 y.o. female presents with the above complaint. History confirmed with patient.  This is been going on severely for the last 1 to 2 months but has been on and off issue for the last year.  She was seeing an orthopedist at [facility].  Has not been in a boot.  She has tried using anti-inflammatories and stretching therapy at home and this has not helped.  A few weeks ago she felt a severe pop and had quite a lot of bruising in the foot  Interval history: Since last visit she has completed the MRI.  Still quite painful.  Objective:  Physical Exam: warm, good capillary refill, no trophic changes or ulcerative lesions, normal DP and PT pulses, normal sensory exam, and severe pain at the insertion of the plantar fascia on the left heel   Radiographs: Multiple views x-ray of the left foot: no fracture, dislocation, swelling or degenerative changes noted  IMPRESSION: 1.  No evidence of fracture or osteonecrosis.   2. Small osteochondral injury about the medial aspect of the talar dome.   3. Tenosynovitis of the tibialis posterior without evidence of tear.   4.  Edema of the deltoid ligament concerning for ligamentous sprain.   5.  No evidence of plantar fasciitis.     Electronically Signed   By: Keane Police D.O.   On: 02/13/2021 21:43   Assessment:   1. Plantar fasciitis of left foot      Plan:  Patient was evaluated and treated and all questions answered.  I personally reviewed the MRI images and I disagree with the radiologist that she does not have Planter fasciitis.  The plantar fascia increases to a maximum thickness of about 7 mm which is abnormal.  She also has significant clinical symptoms that  fit with this as well.  I will send her MRI for a second opinion we discussed further treatment including PRP injection, ultrasound shockwave therapy and Topaz procedures as well as endoscopic plantar fasciotomy.  For now would like to proceed with formal physical therapy.  Physical therapy previously was not helpful for her at [facility] and I recommend we try somewhere different such as [facility].  I sent a referral for this and she will get this scheduled.  I will see her back in 6 weeks. Return in about 6 weeks (around 04/03/2021) for recheck plantar fasciitis.

## 2021-03-11 NOTE — Progress Notes (Signed)
Faxed request for Disc and report to GI

## 2021-03-13 ENCOUNTER — Ambulatory Visit: Payer: Managed Care, Other (non HMO) | Admitting: Podiatry

## 2021-04-03 ENCOUNTER — Ambulatory Visit (INDEPENDENT_AMBULATORY_CARE_PROVIDER_SITE_OTHER): Payer: Managed Care, Other (non HMO) | Admitting: Podiatry

## 2021-04-03 ENCOUNTER — Other Ambulatory Visit: Payer: Self-pay

## 2021-04-03 ENCOUNTER — Encounter: Payer: Self-pay | Admitting: Podiatry

## 2021-04-03 DIAGNOSIS — M629 Disorder of muscle, unspecified: Secondary | ICD-10-CM

## 2021-04-03 DIAGNOSIS — M722 Plantar fascial fibromatosis: Secondary | ICD-10-CM | POA: Diagnosis not present

## 2021-04-05 ENCOUNTER — Encounter: Payer: Self-pay | Admitting: Podiatry

## 2021-04-05 NOTE — Progress Notes (Signed)
?  Subjective:  ?Patient ID: Jeanette Nichols, female    DOB: 12-03-1970,  MRN: [id number] ? ?Chief Complaint  ?Patient presents with  ? Plantar Fasciitis  ?  "Its a little better, but still hurts when I walk or stand.  The pain is less intense"  ? ? ?51 y.o. female presents with the above complaint. History confirmed with patient.  This is been going on severely for the last 1 to 2 months but has been on and off issue for the last year.  She was seeing an orthopedist at [facility].  Has not been in a boot.  She has tried using anti-inflammatories and stretching therapy at home and this has not helped.  A few weeks ago she felt a severe pop and had quite a lot of bruising in the foot ? ?Interval history: ?She has been going to physical therapy for the last few weeks.  Is not sure if it is helping it.  It is better than it was but still hurts when walking and standing.  Has been wearing the boot on and off ? ?Objective:  ?Physical Exam: ?warm, good capillary refill, no trophic changes or ulcerative lesions, normal DP and PT pulses, normal sensory exam, and severe pain at the insertion of the plantar fascia on the left heel ? ? ?Radiographs: ?Multiple views x-ray of the left foot: no fracture, dislocation, swelling or degenerative changes noted ? ?IMPRESSION: ?1.  No evidence of fracture or osteonecrosis. ?  ?2. Small osteochondral injury about the medial aspect of the talar ?dome. ?  ?3. Tenosynovitis of the tibialis posterior without evidence of tear. ?  ?4.  Edema of the deltoid ligament concerning for ligamentous sprain. ?  ?5.  No evidence of plantar fasciitis. ?  ?  ?Electronically Signed ?  By: Keane Police D.O. ?  On: 02/13/2021 21:43 ?  ? ?MRI second opinion [facility] dated 04/03/2021 ? ?#1 central band plantar fasciitis.  Partial thickness 4 mm tear within the central cord distal to its origin.  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  Cartilage  blistered.  Subtle marrow reaction.  No acute OCD ?#3 tenosynovitis tibialis posterior tendon sheath.  No tendon macrotear. ? ?Assessment:  ? ?1. Plantar fasciitis of left foot   ?2. Nontraumatic tear of plantar fascia   ? ? ? ?Plan:  ?Patient was evaluated and treated and all questions answered. ? ?Today we again reviewed her progress and I think she should continue with physical therapy.  I received the second opinion report while she was in the office today and it did show a partial-thickness tear within the plantar fascia.  I think this is likely why it has not healed completely yet.  I recommended she be in the boot for all weightbearing for the next 4 weeks.  We also discussed having her fitted for custom molded longitudinal arch support to offload the plantar fascia in the central heel.  She will be seen by our pedorthist here in the office for fitting. ?Return in about 1 month (around 05/04/2021) for recheck plantar fasciitis.  ? ?

## 2021-04-15 ENCOUNTER — Ambulatory Visit (INDEPENDENT_AMBULATORY_CARE_PROVIDER_SITE_OTHER): Payer: Managed Care, Other (non HMO)

## 2021-04-15 DIAGNOSIS — M216X1 Other acquired deformities of right foot: Secondary | ICD-10-CM | POA: Diagnosis not present

## 2021-04-15 DIAGNOSIS — M629 Disorder of muscle, unspecified: Secondary | ICD-10-CM

## 2021-04-15 DIAGNOSIS — M722 Plantar fascial fibromatosis: Secondary | ICD-10-CM

## 2021-04-15 DIAGNOSIS — M7750 Other enthesopathy of unspecified foot: Secondary | ICD-10-CM

## 2021-04-15 DIAGNOSIS — M216X2 Other acquired deformities of left foot: Secondary | ICD-10-CM

## 2021-04-15 NOTE — Progress Notes (Signed)
SITUATION ?Reason for Consult: Evaluation for Bilateral Custom Foot Orthoses ?Patient / Caregiver Report: Patient is ready for foot orthotics ? ?OBJECTIVE DATA: ?Patient History / Diagnosis:  ?  ICD-10-CM   ?1. Plantar fasciitis of left foot  M72.2   ?  ?2. Nontraumatic tear of plantar fascia  M62.9   ?  ?3. Tendonitis of ankle or foot  M77.50   ?  ? ? ?Current or Previous Devices:   None ? ?Foot Examination: ?Skin presentation:   Intact ?Ulcers & Callousing:   Callusing on 1st met heads bilateral ?Toe / Foot Deformities:  Pes cavus ?Weight Bearing Presentation:  Cavus ?Sensation:    Intact ? ?Shoe Size:    6.51M ? ?ORTHOTIC RECOMMENDATION ?Recommended Device: 1x pair of custom functional foot orthotics ? ?GOALS OF ORTHOSES ?- Reduce Pain ?- Prevent Foot Deformity ?- Prevent Progression of Further Foot Deformity ?- Relieve Pressure ?- Improve the Overall Biomechanical Function of the Foot and Lower Extremity. ? ?ACTIONS PERFORMED ?Potential out of pocket cost was communicated to patient. Patient understood and consent to casting. Patient was casted for Foot Orthoses via crush box. Procedure was explained and patient tolerated procedure well. Casts were shipped to central fabrication. All questions were answered and concerns addressed. ? ?PLAN ?Patient is to be called for fitting when devices are ready.  ? ? ?

## 2021-04-22 ENCOUNTER — Telehealth: Payer: Self-pay | Admitting: *Deleted

## 2021-04-22 NOTE — Telephone Encounter (Signed)
"  I'm calling to see if I can get another boot.  I called last Wednesday and left a message on the extension for the braces and orthotics.  I keep getting voicemail.  I'm a patient of Dr. Sherryle Lis.  Can I get a replacement.  The velcro is not sticking and it's not holding the air.  Will my insurance cover another boot.  Give me a call." ?

## 2021-04-24 DIAGNOSIS — M722 Plantar fascial fibromatosis: Secondary | ICD-10-CM | POA: Diagnosis not present

## 2021-04-24 NOTE — Telephone Encounter (Signed)
I'm returning your call.  We can give you another boot but I am not sure if your insurance will cover another boot.  "Well, can you file it and put what happened to the other boot?  It is worn out.  It doesn't keep the air anymore and it's torn on the side.  Also put that it is medically necessary."  I can't do it but I will pass your message on to Jocelyn Lamer in our billing department.  I'm not sure if she can do that or not.  "Can I come by to pick up the boot?"  Yes, what size shoe do you wear and is it a tall or short boot?  "I wear a size 6.5 shoe and it comes up to my calf." ?

## 2021-05-06 ENCOUNTER — Ambulatory Visit (INDEPENDENT_AMBULATORY_CARE_PROVIDER_SITE_OTHER): Payer: Managed Care, Other (non HMO) | Admitting: Podiatry

## 2021-05-06 DIAGNOSIS — M629 Disorder of muscle, unspecified: Secondary | ICD-10-CM | POA: Diagnosis not present

## 2021-05-06 NOTE — Progress Notes (Signed)
?  Subjective:  ?Patient ID: Jeanette Nichols, female    DOB: November 03, 1970,  MRN: [id number] ? ?Chief Complaint  ?Patient presents with  ? Plantar Fasciitis  ?  Follow up left foot  ? ? ?51 y.o. female presents with the above complaint. History confirmed with patient.  This is been going on severely for the last 1 to 2 months but has been on and off issue for the last year.  She was seeing an orthopedist at [facility].  Has not been in a boot.  She has tried using anti-inflammatories and stretching therapy at home and this has not helped.  A few weeks ago she felt a severe pop and had quite a lot of bruising in the foot ? ?Interval history: ?She has been in the boot fairly consistently for the last month.  She was fitted for her orthotics.  She took a brief break from physical therapy which is also ? ?Objective:  ?Physical Exam: ?warm, good capillary refill, no trophic changes or ulcerative lesions, normal DP and PT pulses, normal sensory exam, and mild pain at the insertion of the plantar fascia medially, none centrally or in the mid fascia ? ? ?Radiographs: ?Multiple views x-ray of the left foot: no fracture, dislocation, swelling or degenerative changes noted ? ?IMPRESSION: ?1.  No evidence of fracture or osteonecrosis. ?  ?2. Small osteochondral injury about the medial aspect of the talar ?dome. ?  ?3. Tenosynovitis of the tibialis posterior without evidence of tear. ?  ?4.  Edema of the deltoid ligament concerning for ligamentous sprain. ?  ?5.  No evidence of plantar fasciitis. ?  ?  ?Electronically Signed ?  By: Keane Police D.O. ?  On: 02/13/2021 21:43 ?  ? ?MRI second opinion [facility] dated 04/03/2021 ? ?#1 central band plantar fasciitis.  Partial thickness 4 mm tear within the central cord distal to its origin.  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  Cartilage blistered.  Subtle marrow reaction.  No acute OCD ?#3 tenosynovitis tibialis posterior  tendon sheath.  No tendon macrotear. ? ?Assessment:  ? ?1. Nontraumatic tear of plantar fascia   ? ? ? ? ?Plan:  ?Patient was evaluated and treated and all questions answered. ? ?Doing well has improved with boot immobilization.  Advised she can continue to increase her weightbearing in shoes.  Discussed offloading with plantar fascia brace or taping and she will try this as well.  I think she should continue physical therapy and restart this to get back to her full activity level.  Advised may take 1 more months to get close to where she wants to be.  I will see her back in a month ? ?Return in about 1 month (around 06/06/2021) for recheck plantar fascial tear.  ? ?

## 2021-05-27 ENCOUNTER — Ambulatory Visit (INDEPENDENT_AMBULATORY_CARE_PROVIDER_SITE_OTHER): Payer: Managed Care, Other (non HMO)

## 2021-05-27 DIAGNOSIS — M629 Disorder of muscle, unspecified: Secondary | ICD-10-CM

## 2021-05-27 DIAGNOSIS — M7750 Other enthesopathy of unspecified foot: Secondary | ICD-10-CM

## 2021-05-27 DIAGNOSIS — M722 Plantar fascial fibromatosis: Secondary | ICD-10-CM

## 2021-05-27 NOTE — Progress Notes (Signed)
SITUATION: Reason for Visit: Fitting and Delivery of Custom Fabricated Foot Orthoses Patient Report: Patient reports comfort and is satisfied with device.  OBJECTIVE DATA: Patient History / Diagnosis:     ICD-10-CM   1. Plantar fasciitis of left foot  M72.2     2. Nontraumatic tear of plantar fascia  M62.9     3. Tendonitis of ankle or foot  M77.50       Provided Device:  Custom Functional Foot Orthotics     [employer]: [id number]  GOAL OF ORTHOSIS - Improve gait - Decrease energy expenditure - Improve Balance - Provide Triplanar stability of foot complex - Facilitate motion  ACTIONS PERFORMED Patient was fit with foot orthotics trimmed to shoe last. Patient tolerated fittign procedure.   Patient was provided with verbal and written instruction and demonstration regarding donning, doffing, wear, care, proper fit, function, purpose, cleaning, and use of the orthosis and in all related precautions and risks and benefits regarding the orthosis.  Patient was also provided with verbal instruction regarding how to report any failures or malfunctions of the orthosis and necessary follow up care. Patient was also instructed to contact our office regarding any change in status that may affect the function of the orthosis.  Patient demonstrated independence with proper donning, doffing, and fit and verbalized understanding of all instructions.  PLAN: Patient is to follow up in one week or as necessary (PRN). All questions were answered and concerns addressed. Plan of care was discussed with and agreed upon by the patient.

## 2021-06-05 ENCOUNTER — Ambulatory Visit: Payer: Managed Care, Other (non HMO) | Admitting: Podiatry

## 2021-06-26 ENCOUNTER — Ambulatory Visit: Payer: Managed Care, Other (non HMO) | Admitting: Podiatry

## 2023-09-24 IMAGING — MR MR HEEL *L* W/O CM
4 of 5 series · 13 of 40 positions shown · non-contrast
Comparison: Foot radiographs dated January 30, 2021

CLINICAL DATA: Severe heel pain for 2 months.

EXAM:
MR OF THE LEFT HEEL WITHOUT CONTRAST
TECHNIQUE: Multiplanar, multisequence MR imaging of the left ankle/heel
radiograph was performed. No intravenous contrast was administered.

[Series 3: PD fat-sat · axial · left · 3.0mm · 0.25mm/px · z∈[-78,+18]mm · 4 of 30 slices shown]
[im 1/30]
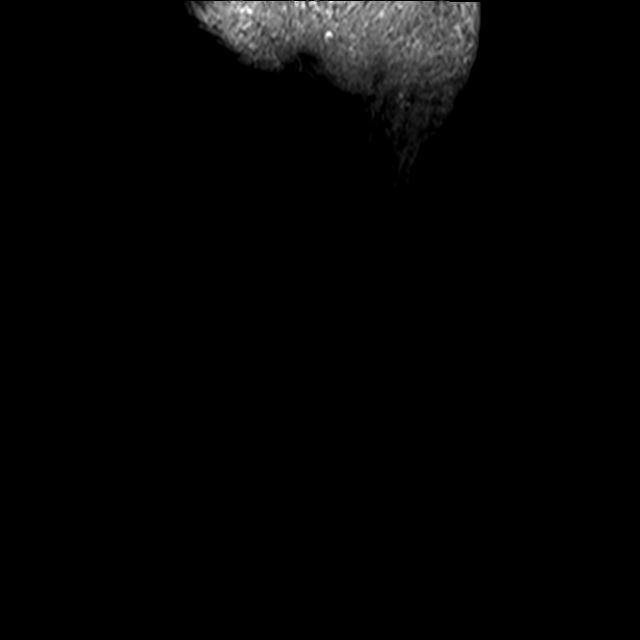
[im 5/30]
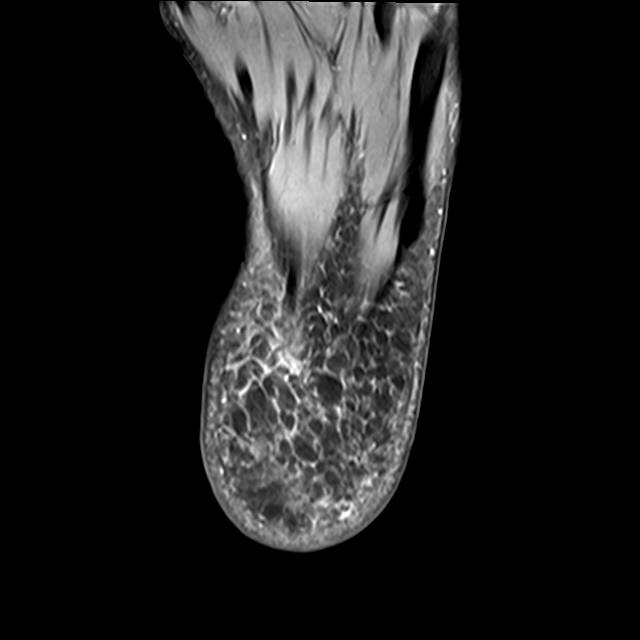
[im 17/30]
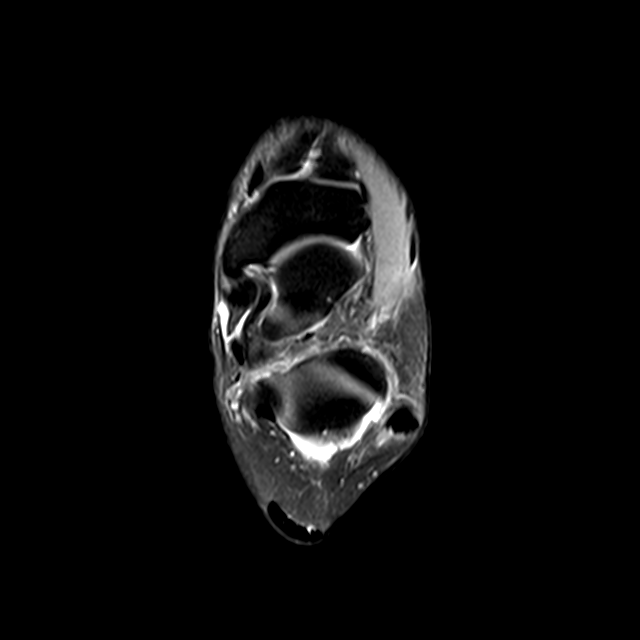
[im 25/30]
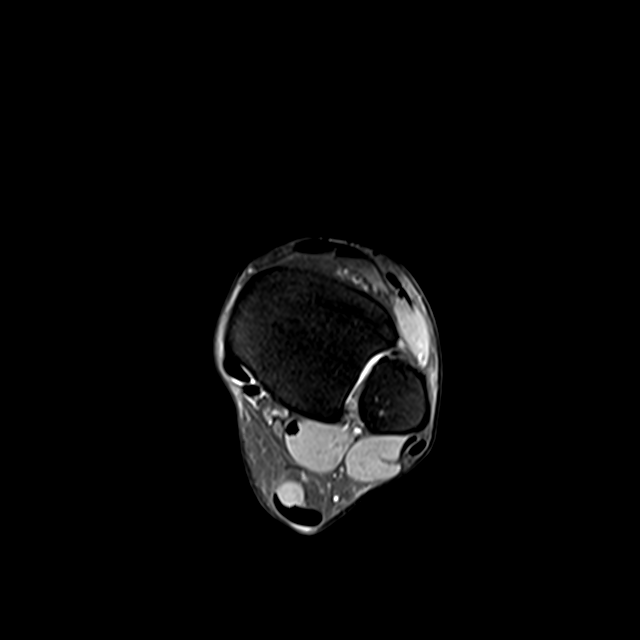

[Series 4: T2 fat-sat · axial · left · 3.0mm · 0.25mm/px · z∈[-62,+18]mm · 3 of 30 slices shown (1 of 2)]
[im 5/30]
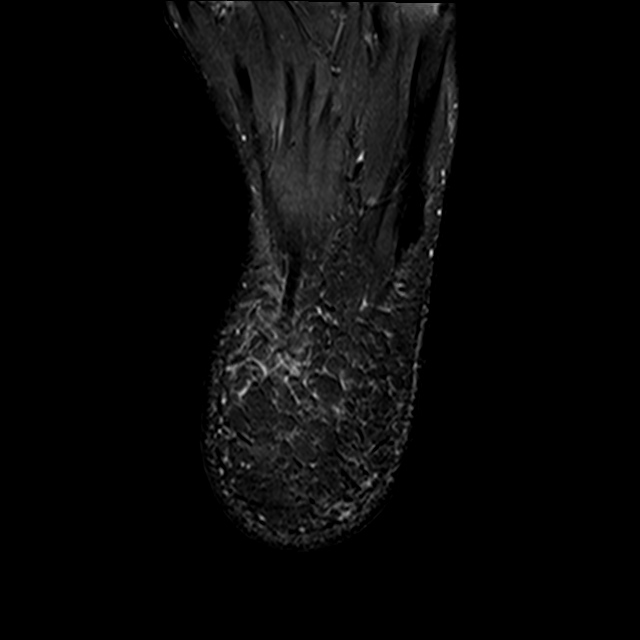
[im 17/30]
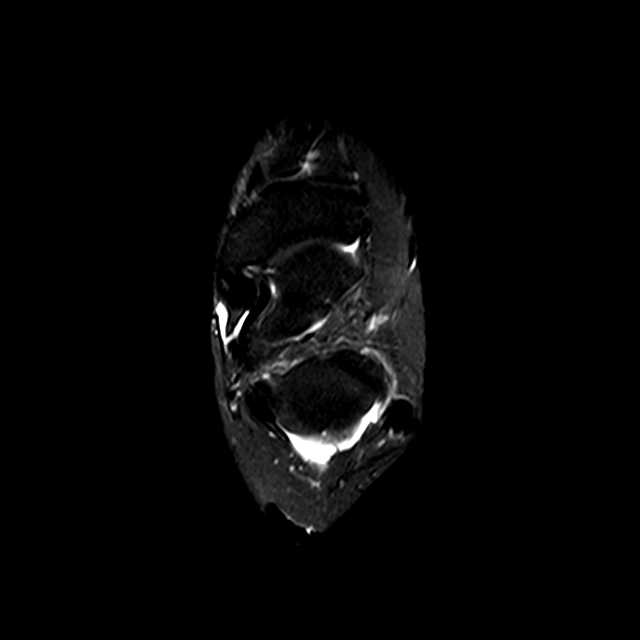
[im 25/30]
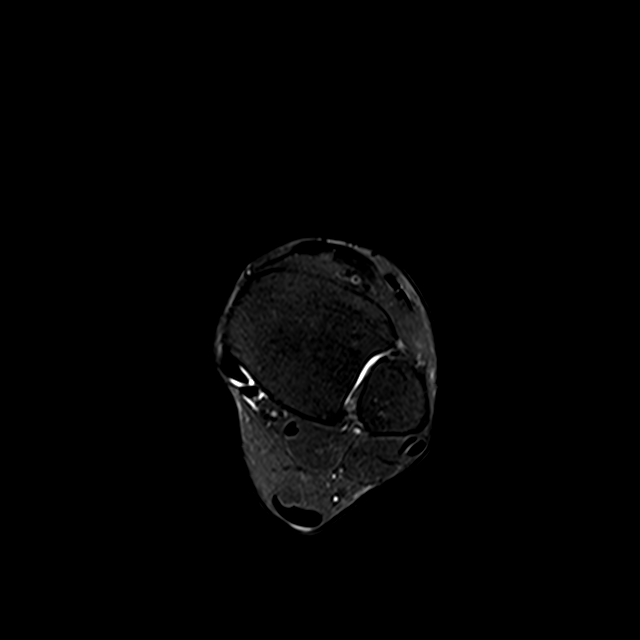

[Series 5: T1 · sagittal · left · 4.0mm · 0.27mm/px · 3 of 24 slices shown]
[im 4/24]
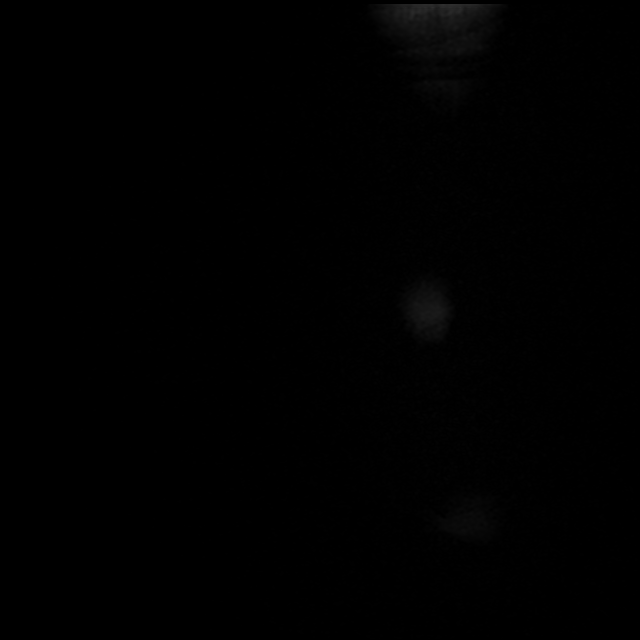
[im 12/24]
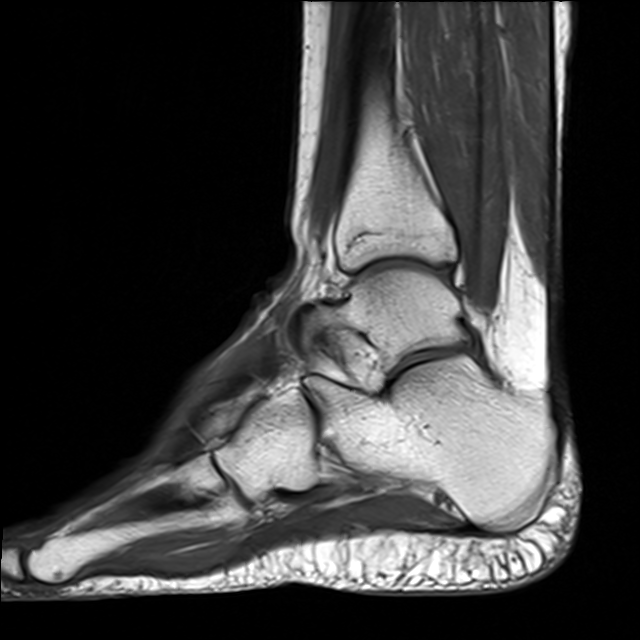
[im 20/24]
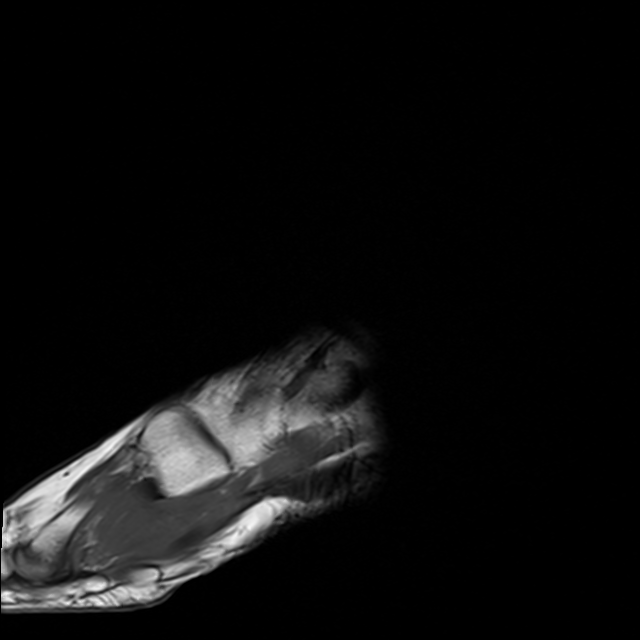

[Series 7: T2 fat-sat · coronal · left · 3.0mm · 0.25mm/px · 3 of 36 slices shown (2 of 2)]
[im 4/36]
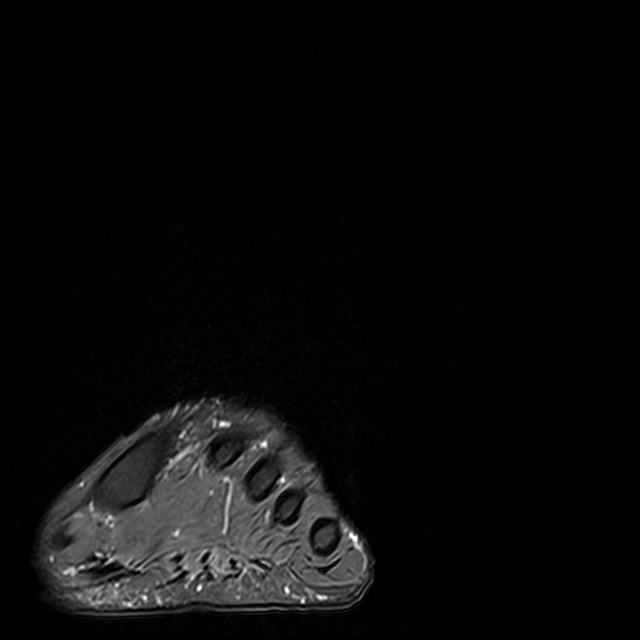
[im 20/36]
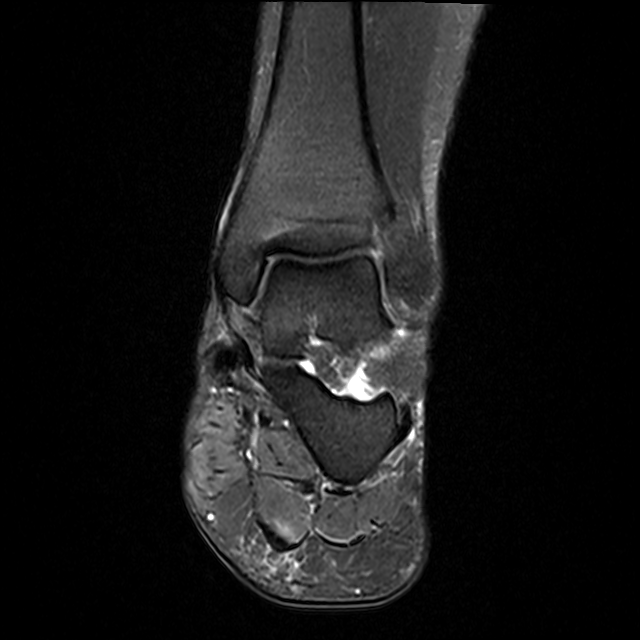
[im 32/36]
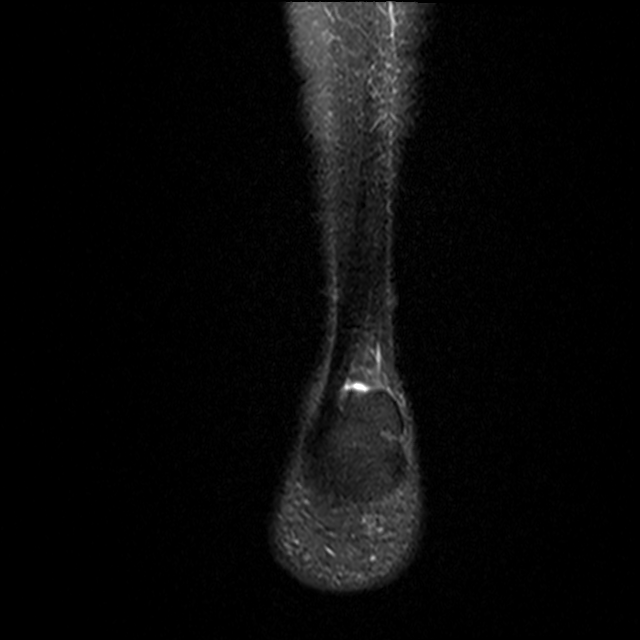

[13 of 40 positions shown; findings below may reference images not displayed]

FINDINGS: TENDONS

Peroneal: Peroneal longus tendon intact. Peroneal brevis intact.

Posteromedial: Posterior tibial tendon is normal in size with
adjacent fluid signal concerning for tenosynovitis. Flexor hallucis
longus tendon intact. Flexor digitorum longus tendon intact.

Anterior: Tibialis anterior tendon intact. Extensor hallucis longus
tendon intact Extensor digitorum longus tendon intact.

Achilles:  Intact.

Plantar Fascia: Plantar fascia is normal in size and signal.

LIGAMENTS

Lateral: Anterior talofibular ligament intact. Calcaneofibular
ligament intact. Posterior talofibular ligament intact. Anterior and
posterior tibiofibular ligaments intact.

Medial: Mild edema along the deltoid ligament concerning for
ligamentous strain. Spring ligament intact.

CARTILAGE

Ankle Joint: Small joint effusion. Normal ankle mortise. Minimal
chondral irregularity and subchondral edema about the medial aspect
of the talar dome.

Subtalar Joints/Sinus Tarsi: Normal subtalar joints. No subtalar
joint effusion. Normal sinus tarsi.

Bones: No marrow signal abnormality.  No fracture or dislocation.

Soft Tissue: No fluid collection or hematoma. Muscles are normal
without edema or atrophy. Tarsal tunnel is normal.
IMPRESSION: 1.  No evidence of fracture or osteonecrosis.

2. Small osteochondral injury about the medial aspect of the talar
dome.

3. Tenosynovitis of the tibialis posterior without evidence of tear.

4.  Edema of the deltoid ligament concerning for ligamentous sprain.

5.  No evidence of plantar fasciitis.
